# Patient Record
Sex: Male | Born: 2000 | Race: White | Hispanic: No | Marital: Single | Smoking: Current every day smoker
Health system: Southern US, Community
[De-identification: ages and names within clinical notes are randomized; demographics above are authoritative.]

## PROBLEM LIST (undated history)

## (undated) DIAGNOSIS — E119 Type 2 diabetes mellitus without complications: Secondary | ICD-10-CM

---
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
## 2018-03-28 ENCOUNTER — Emergency Department (HOSPITAL_COMMUNITY)
Admission: EM | Admit: 2018-03-28 | Discharge: 2018-03-28 | Disposition: A | Discharge status: Home / Self Care | Payer: BLUE CROSS/BLUE SHIELD | Attending: Emergency Medicine | Admitting: Emergency Medicine

## 2018-03-28 ENCOUNTER — Encounter (HOSPITAL_COMMUNITY): Payer: Self-pay | Admitting: *Deleted

## 2018-03-28 DIAGNOSIS — Z794 Long term (current) use of insulin: Secondary | ICD-10-CM | POA: Diagnosis not present

## 2018-03-28 DIAGNOSIS — W228XXA Striking against or struck by other objects, initial encounter: Secondary | ICD-10-CM | POA: Diagnosis not present

## 2018-03-28 DIAGNOSIS — Y92009 Unspecified place in unspecified non-institutional (private) residence as the place of occurrence of the external cause: Secondary | ICD-10-CM | POA: Diagnosis not present

## 2018-03-28 DIAGNOSIS — E119 Type 2 diabetes mellitus without complications: Secondary | ICD-10-CM | POA: Insufficient documentation

## 2018-03-28 DIAGNOSIS — S0990XA Unspecified injury of head, initial encounter: Secondary | ICD-10-CM

## 2018-03-28 DIAGNOSIS — Y939 Activity, unspecified: Secondary | ICD-10-CM | POA: Insufficient documentation

## 2018-03-28 DIAGNOSIS — Y999 Unspecified external cause status: Secondary | ICD-10-CM | POA: Insufficient documentation

## 2018-03-28 DIAGNOSIS — F17210 Nicotine dependence, cigarettes, uncomplicated: Secondary | ICD-10-CM | POA: Diagnosis not present

## 2018-03-28 NOTE — ED Triage Notes (Signed)
Pt in stating he was working on a house yesterday and a ceiling joyce fell on his head, possible LOC, reports some headaches today but states its mild, no distress noted
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
## 2018-03-28 NOTE — Discharge Instructions (Addendum)
°  Head Injury °You have been seen today for a head injury. It does not appear to be serious at this time.  °Close observation: The close observation period is usually 6 hours from the injury. This includes staying awake and having a trustworthy adult monitor you to assure your condition does not worsen. You should be in regular contact with this person and ideally, they should be able to monitor you in person.  °Secondary observation: The secondary observation period is usually 24 hours from the injury. You are allowed to sleep during this time. A trustworthy adult should intermittently monitor you to assure your condition does not worsen.  ° °Overall head injury/concussion care: °Rest: Be sure to get plenty of rest. You will need more rest and sleep while you recover. °Hydration: Be sure to stay well hydrated by having a goal of drinking about 0.5 liters of water an hour. °Pain:  °Antiinflammatory medications: Take 600 mg of ibuprofen every 6 hours or 440 mg (over the counter dose) to 500 mg (prescription dose) of naproxen every 12 hours or for the next 3 days. After this time, these medications may be used as needed for pain. Take these medications with food to avoid upset stomach. Choose only one of these medications, do not take them together. °Tylenol: Should you continue to have additional pain while taking the ibuprofen or naproxen, you may add in tylenol as needed. Your daily total maximum amount of tylenol from all sources should be limited to 4000mg/day for persons without liver problems, or 2000mg/day for those with liver problems. °Return to sports and activities: In general, you may return to normal activities once symptoms have subsided, however, you would ideally be cleared by a primary care provider or other qualified medical professional prior to return to these activities. ° °Follow up: Follow up with the concussion clinic or your primary care provider for further management of this issue. °Return:  Return to the ED should you begin to have confusion, abnormal behavior, aggression, violence, or personality changes, repeated vomiting, vision loss, numbness or weakness on one side of the body, difficulty standing due to dizziness, significantly worsening pain, or any other major concerns. °

## 2018-03-28 NOTE — ED Notes (Signed)
Patient given discharge instructions and verbalized understanding.  Patient stable to discharge at this time.  Patient is alert and oriented to baseline.  No distressed noted at this time.  All belongings taken with the patient at discharge.   

## 2018-05-06 ENCOUNTER — Other Ambulatory Visit: Payer: Self-pay

## 2018-05-06 ENCOUNTER — Encounter (HOSPITAL_COMMUNITY): Payer: Self-pay

## 2018-05-06 ENCOUNTER — Ambulatory Visit (HOSPITAL_COMMUNITY)
Admission: EM | Admit: 2018-05-06 | Discharge: 2018-05-06 | Disposition: A | Discharge status: Home / Self Care | Payer: BLUE CROSS/BLUE SHIELD | Attending: Family Medicine | Admitting: Family Medicine

## 2018-05-06 DIAGNOSIS — L0201 Cutaneous abscess of face: Secondary | ICD-10-CM | POA: Diagnosis not present

## 2018-05-06 DIAGNOSIS — L03213 Periorbital cellulitis: Secondary | ICD-10-CM

## 2018-05-06 MED ORDER — SULFAMETHOXAZOLE-TRIMETHOPRIM 800-160 MG PO TABS: 1.0000 | ORAL_TABLET | Freq: Two times a day (BID) | ORAL | 0 refills | Status: AC

## 2018-05-06 MED ORDER — AMOXICILLIN-POT CLAVULANATE 875-125 MG PO TABS: 1.0000 | ORAL_TABLET | Freq: Two times a day (BID) | ORAL | 0 refills | Status: DC

## 2018-05-06 NOTE — Discharge Instructions (Signed)
Apply warm compresses 3-4x daily for 10-15 minutes Wash site daily with warm water and mild soap Keep covered to avoid friction Dual therapy is recommending for infections occurring around the eye Bactrim and augmentin prescribed.  Take as directed and to completion Follow up here or with PCP if symptoms do not improve over the next 24-48 hours with treatment Return or go to the ED if you have any new or worsening symptoms fever, chills, nausea, vomiting, increased redness, swelling, or pain, difficulty or pain with eye movements, vision changes, symptoms that do not improve with medications, etc..Marland Kitchen

## 2018-05-06 NOTE — ED Provider Notes (Signed)
[facility] CARE CENTER   [id number] 05/06/18 Arrival Time: 1626  CC: Abscess and eye swellling  SUBJECTIVE:  Shane Michael is a 18 y.o. male hx significant for type 1 DM, who presents with possible abscess x >1 week.  Symptoms began after he hit his head on a cabinet door last week.  Shortly afterwards it began to appear infected.  Patient tried "popping" abscess last night, and then woke up this morning with RT eyelid swelling.   Describes it as painful, with active purulent drainage.  Has tried cleaning without improvement.  Denies similar symptoms in the past.  Complains of associated swelling, redness, and purulent drainage.  Denies fever, chills, vision changes, painful EOM, nausea, vomiting, SOB, chest pain, abdominal pain, changes in bowel or bladder function.    ROS: As per HPI.  Past Medical History:  Diagnosis Date  . Diabetes mellitus without complication (HCC)    History reviewed. No pertinent surgical history. No Known Allergies No current facility-administered medications on file prior to encounter.    Current Outpatient Medications on File Prior to Encounter  Medication Sig Dispense Refill  . ibuprofen (ADVIL,MOTRIN) 600 MG tablet Take 1 tablet (600 mg total) by mouth every 6 (six) hours as needed. 30 tablet 0  . insulin aspart (NOVOLOG) 100 UNIT/ML injection Inject 1-5 Units into the skin See admin instructions. Take 1 (one) unit for every 10 grams of carbohydrates consumed. 150 is 1 unit, 200 is 2 unit, 250 is 3 unit.    Marland Kitchen insulin glargine (LANTUS) 100 UNIT/ML injection Inject 19 Units into the skin daily.      Social History   Socioeconomic History  . Marital status: Single    Spouse name: Not on file  . Number of children: Not on file  . Years of education: Not on file  . Highest education level: Not on file  Occupational History  . Not on file  Social Needs  . Financial resource strain: Not on file  . Food insecurity:    Worry: Not on file    Inability: Not  on file  . Transportation needs:    Medical: Not on file    Non-medical: Not on file  Tobacco Use  . Smoking status: Current Every Day Smoker  . Smokeless tobacco: Never Used  Substance and Sexual Activity  . Alcohol use: No  . Drug use: No  . Sexual activity: Not on file  Lifestyle  . Physical activity:    Days per week: Not on file    Minutes per session: Not on file  . Stress: Not on file  Relationships  . Social connections:    Talks on phone: Not on file    Gets together: Not on file    Attends religious service: Not on file    Active member of club or organization: Not on file    Attends meetings of clubs or organizations: Not on file    Relationship status: Not on file  . Intimate partner violence:    Fear of current or ex partner: Not on file    Emotionally abused: Not on file    Physically abused: Not on file    Forced sexual activity: Not on file  Other Topics Concern  . Not on file  Social History Narrative  . Not on file   History reviewed. No pertinent family history.  OBJECTIVE: Vitals:   05/06/18 1637 05/06/18 1640  BP:  135/67  Pulse:  88  Resp:  18  Temp:  97.6 F (36.4 C)  TempSrc:  Oral  SpO2:  99%  Weight: 141 lb (64 kg)     General appearance: alert; no distress Head: NCAT Eyes: Mild RT preseptal swelling, without overlying erythema; No conjunctival erythema. PERRL; EOMI without discomfort;  no obvious drainage Lungs: clear to auscultation bilaterally Heart: regular rate and rhythm.  Radial pulse 2+ bilaterally Extremities: no edema Skin: warm and dry; 1-2 cm abscess with mild surrounding erythema localized to RT forehead, purulent drainage evident, mildly TTP (see picture below) Psychological: alert and cooperative; normal mood and affect      ASSESSMENT & PLAN:  1. Abscess of forehead   2. Preseptal cellulitis of right upper eyelid     Meds ordered this encounter  Medications  . sulfamethoxazole-trimethoprim (BACTRIM  DS,SEPTRA DS) 800-160 MG tablet    Sig: Take 1 tablet by mouth 2 (two) times daily for 10 days.    Dispense:  20 tablet    Refill:  0    Order Specific Question:   Supervising Provider    Answer:   Eustace Moore [[id number]]  . amoxicillin-clavulanate (AUGMENTIN) 875-125 MG tablet    Sig: Take 1 tablet by mouth every 12 (twelve) hours.    Dispense:  14 tablet    Refill:  0    Order Specific Question:   Supervising Provider    Answer:   Eustace Moore [[id number]]   Apply warm compresses 3-4x daily for 10-15 minutes Wash site daily with warm water and mild soap Keep covered to avoid friction Dual therapy is recommending for infections occurring around the eye Bactrim and augmentin prescribed.  Take as directed and to completion Follow up here or with PCP if symptoms do not improve over the next 24-48 hours with treatment Return or go to the ED if you have any new or worsening symptoms fever, chills, nausea, vomiting, increased redness, swelling, or pain, difficulty or pain with eye movements, vision changes, symptoms that do not improve with medications, etc...  Reviewed expectations re: course of current medical issues. Questions answered. Outlined signs and symptoms indicating need for more acute intervention. Patient verbalized understanding. After Visit Summary given.   Rennis Harding, PA-C 05/06/18 1709

## 2018-05-06 NOTE — ED Triage Notes (Signed)
Pt cc pt states he strapped his head on corner of the  cabinet door. This happened a week ago. Pt right eye is swollen shut.

## 2018-06-19 ENCOUNTER — Ambulatory Visit (INDEPENDENT_AMBULATORY_CARE_PROVIDER_SITE_OTHER): Payer: BLUE CROSS/BLUE SHIELD

## 2018-06-19 ENCOUNTER — Ambulatory Visit (HOSPITAL_COMMUNITY)
Admission: EM | Admit: 2018-06-19 | Discharge: 2018-06-19 | Disposition: A | Discharge status: Home / Self Care | Payer: BLUE CROSS/BLUE SHIELD | Attending: Family Medicine | Admitting: Family Medicine

## 2018-06-19 ENCOUNTER — Encounter (HOSPITAL_COMMUNITY): Payer: Self-pay | Admitting: Family Medicine

## 2018-06-19 DIAGNOSIS — S90212A Contusion of left great toe with damage to nail, initial encounter: Secondary | ICD-10-CM | POA: Diagnosis not present

## 2018-06-19 DIAGNOSIS — S92405A Nondisplaced unspecified fracture of left great toe, initial encounter for closed fracture: Secondary | ICD-10-CM
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
## 2018-06-19 NOTE — Discharge Instructions (Signed)
Please return in two weeks for follow up  Wear the post-op shoe to keep the toe from bending when you are walking about.

## 2019-03-15 DIAGNOSIS — S91309A Unspecified open wound, unspecified foot, initial encounter: Secondary | ICD-10-CM

## 2019-03-15 DIAGNOSIS — E119 Type 2 diabetes mellitus without complications: Secondary | ICD-10-CM

## 2019-03-15 DIAGNOSIS — R7989 Other specified abnormal findings of blood chemistry: Secondary | ICD-10-CM

## 2019-03-15 DIAGNOSIS — E871 Hypo-osmolality and hyponatremia: Secondary | ICD-10-CM

## 2019-03-16 DIAGNOSIS — E119 Type 2 diabetes mellitus without complications: Secondary | ICD-10-CM | POA: Diagnosis not present

## 2019-03-16 DIAGNOSIS — S91309A Unspecified open wound, unspecified foot, initial encounter: Secondary | ICD-10-CM | POA: Diagnosis not present

## 2019-03-16 DIAGNOSIS — E871 Hypo-osmolality and hyponatremia: Secondary | ICD-10-CM | POA: Diagnosis not present

## 2019-03-16 DIAGNOSIS — R7989 Other specified abnormal findings of blood chemistry: Secondary | ICD-10-CM | POA: Diagnosis not present

## 2020-05-10 ENCOUNTER — Other Ambulatory Visit: Payer: Self-pay

## 2020-05-10 ENCOUNTER — Encounter (HOSPITAL_COMMUNITY): Payer: Self-pay | Admitting: Emergency Medicine

## 2020-05-10 ENCOUNTER — Emergency Department (HOSPITAL_COMMUNITY): Payer: 59

## 2020-05-10 ENCOUNTER — Emergency Department (HOSPITAL_COMMUNITY)
Admission: EM | Admit: 2020-05-10 | Discharge: 2020-05-10 | Disposition: A | Discharge status: Home / Self Care | Payer: 59 | Attending: Emergency Medicine | Admitting: Emergency Medicine

## 2020-05-10 DIAGNOSIS — Z23 Encounter for immunization: Secondary | ICD-10-CM | POA: Insufficient documentation

## 2020-05-10 DIAGNOSIS — R739 Hyperglycemia, unspecified: Secondary | ICD-10-CM

## 2020-05-10 DIAGNOSIS — L089 Local infection of the skin and subcutaneous tissue, unspecified: Secondary | ICD-10-CM

## 2020-05-10 DIAGNOSIS — E11621 Type 2 diabetes mellitus with foot ulcer: Secondary | ICD-10-CM | POA: Diagnosis not present

## 2020-05-10 DIAGNOSIS — L97519 Non-pressure chronic ulcer of other part of right foot with unspecified severity: Secondary | ICD-10-CM | POA: Insufficient documentation

## 2020-05-10 DIAGNOSIS — M79671 Pain in right foot: Secondary | ICD-10-CM | POA: Diagnosis present

## 2020-05-10 DIAGNOSIS — F17200 Nicotine dependence, unspecified, uncomplicated: Secondary | ICD-10-CM | POA: Diagnosis not present

## 2020-05-10 DIAGNOSIS — Z794 Long term (current) use of insulin: Secondary | ICD-10-CM | POA: Diagnosis not present

## 2020-05-10 DIAGNOSIS — E1165 Type 2 diabetes mellitus with hyperglycemia: Secondary | ICD-10-CM | POA: Diagnosis not present

## 2020-05-10 LAB — CBC WITH DIFFERENTIAL/PLATELET
Abs Immature Granulocytes: 0.04 10*3/uL (ref 0.00–0.07)
Basophils Absolute: 0.1 10*3/uL (ref 0.0–0.1)
Basophils Relative: 0 %
Eosinophils Absolute: 0.2 10*3/uL (ref 0.0–0.5)
Eosinophils Relative: 1 %
HCT: 42.5 % (ref 39.0–52.0)
Hemoglobin: 15 g/dL (ref 13.0–17.0)
Immature Granulocytes: 0 %
Lymphocytes Relative: 17 %
Lymphs Abs: 2 10*3/uL (ref 0.7–4.0)
MCH: 31.3 pg (ref 26.0–34.0)
MCHC: 35.3 g/dL (ref 30.0–36.0)
MCV: 88.5 fL (ref 80.0–100.0)
Monocytes Absolute: 1.4 10*3/uL — ABNORMAL HIGH (ref 0.1–1.0)
Monocytes Relative: 11 %
Neutro Abs: 8.7 10*3/uL — ABNORMAL HIGH (ref 1.7–7.7)
Neutrophils Relative %: 71 %
Platelets: 276 10*3/uL (ref 150–400)
RBC: 4.8 MIL/uL (ref 4.22–5.81)
RDW: 11.9 % (ref 11.5–15.5)
WBC: 12.4 10*3/uL — ABNORMAL HIGH (ref 4.0–10.5)
nRBC: 0 % (ref 0.0–0.2)

## 2020-05-10 LAB — BASIC METABOLIC PANEL
Anion gap: 12 (ref 5–15)
BUN: 20 mg/dL (ref 6–20)
CO2: 22 mmol/L (ref 22–32)
Calcium: 9.3 mg/dL (ref 8.9–10.3)
Chloride: 95 mmol/L — ABNORMAL LOW (ref 98–111)
Creatinine, Ser: 0.76 mg/dL (ref 0.61–1.24)
GFR, Estimated: >60 mL/min (ref >60)
Glucose, Bld: 508 mg/dL (ref 70–99)
Potassium: 4.3 mmol/L (ref 3.5–5.1)
Sodium: 129 mmol/L — ABNORMAL LOW (ref 135–145)

## 2020-05-10 LAB — CBG MONITORING, ED
Glucose-Capillary: 501 mg/dL (ref 70–99)
Glucose-Capillary: 284 mg/dL — ABNORMAL HIGH (ref 70–99)

## 2020-05-10 MED ORDER — DOXYCYCLINE HYCLATE 100 MG PO CAPS: 100.0000 mg | ORAL_CAPSULE | Freq: Two times a day (BID) | ORAL | 0 refills | Status: AC

## 2020-05-10 MED ORDER — TETANUS-DIPHTH-ACELL PERTUSSIS 5-2.5-18.5 LF-MCG/0.5 IM SUSY
0.5000 mL | PREFILLED_SYRINGE | Freq: Once | INTRAMUSCULAR | Status: AC
  Administered 2020-05-10: 0.5 mL via INTRAMUSCULAR
  Filled 2020-05-10: qty 0.5

## 2020-05-10 MED ORDER — SODIUM CHLORIDE 0.9 % IV SOLN
3.0000 g | Freq: Once | INTRAVENOUS | Status: AC
  Administered 2020-05-10: 3 g via INTRAVENOUS
  Filled 2020-05-10: qty 8

## 2020-05-10 MED ORDER — SODIUM CHLORIDE 0.9 % IV BOLUS
1000.0000 mL | Freq: Once | INTRAVENOUS | Status: AC
  Administered 2020-05-10: 1000 mL via INTRAVENOUS

## 2020-05-10 MED ORDER — INSULIN ASPART 100 UNIT/ML ~~LOC~~ SOLN
10.0000 [IU] | Freq: Once | SUBCUTANEOUS | Status: AC
  Administered 2020-05-10: 10 [IU] via INTRAVENOUS
  Filled 2020-05-10: qty 0.1

## 2020-05-10 MED ORDER — LIDOCAINE-EPINEPHRINE (PF) 2 %-1:200000 IJ SOLN
10.0000 mL | Freq: Once | INTRAMUSCULAR | Status: AC
  Administered 2020-05-10: 10 mL
  Filled 2020-05-10: qty 20

## 2020-05-10 NOTE — ED Triage Notes (Signed)
Pt stepped on a kabob stick with his R foot 2-3 days ago. Reports that he tried to get most of it out, but the area is looking worse. Erythema noted. Pt is diabetic.

## 2020-05-10 NOTE — ED Provider Notes (Signed)
[facility]   CSN: [id number] Arrival date & time: 05/10/20  0202     History Chief Complaint  Patient presents with  . Foreign body in Foot    Shane Michael is a 20 y.o. male.  Patient presents to the emergency department with a chief complaint of foot pain.  He states that he stepped on a wooden kebab stick a couple of days ago.  States that the stick broke in the middle, and he got a splinter on the ball of his foot.  He states that he was able to retrieve 1 small splinter, but is uncertain if there is more that remain in his foot.  He reports swelling and pain and redness.  Denies any fever.  He is diabetic.  The history is provided by the patient. No language interpreter was used.       Past Medical History:  Diagnosis Date  . Diabetes mellitus without complication [facility])     Patient Active Problem List   Diagnosis Date Noted  . Uncontrolled diabetes mellitus (HCC) 01/04/2012  . DKA (diabetic ketoacidoses) 01/02/2012  . Dehydration 01/02/2012    History reviewed. No pertinent surgical history.     History reviewed. No pertinent family history.  Social History   Tobacco Use  . Smoking status: Current Every Day Smoker  . Smokeless tobacco: Never Used  Substance Use Topics  . Alcohol use: No  . Drug use: No    Home Medications Prior to Admission medications   Medication Sig Start Date End Date Taking? Authorizing Provider  ibuprofen (ADVIL,MOTRIN) 600 MG tablet Take 1 tablet (600 mg total) by mouth every 6 (six) hours as needed. 11/03/14   Niel Hummer, MD  insulin aspart (NOVOLOG) 100 UNIT/ML injection Inject 1-5 Units into the skin See admin instructions. Take 1 (one) unit for every 10 grams of carbohydrates consumed. 150 is 1 unit, 200 is 2 unit, 250 is 3 unit.    [provider]  insulin glargine (LANTUS) 100 UNIT/ML injection Inject 19 Units into the skin daily.     [provider]     Allergies    Patient has no known allergies.  Review of Systems   Review of Systems  All other systems reviewed and are negative.   Physical Exam Updated Vital Signs BP 121/80 (BP Location: Left Arm)   Pulse 82   Temp 97.9 F (36.6 C) (Oral)   Resp 18   Ht 5\' 11"  (1.803 m)   Wt 63.5 kg   SpO2 99%   BMI 19.53 kg/m   Physical Exam Vitals and nursing note reviewed.  Constitutional:      General: He is not in acute distress.    Appearance: He is well-developed. He is not ill-appearing.  HENT:     Head: Normocephalic and atraumatic.  Eyes:     Conjunctiva/sclera: Conjunctivae normal.  Cardiovascular:     Rate and Rhythm: Normal rate.  Pulmonary:     Effort: Pulmonary effort is normal. No respiratory distress.  Abdominal:     General: There is no distension.  Musculoskeletal:     Cervical back: Neck supple.     Comments: Moves all extremities  Skin:    General: Skin is warm and dry.     Comments: Erythema and swelling at the base of the 3rd toe on the plantar surface  Neurological:     Mental Status: He is alert and oriented to person, place, and time.  Psychiatric:  Mood and Affect: Mood normal.        Behavior: Behavior normal.     ED Results / Procedures / Treatments   Labs (all labs ordered are listed, but only abnormal results are displayed) Labs Reviewed  CBC WITH DIFFERENTIAL/PLATELET  BASIC METABOLIC PANEL    EKG None  Radiology No results found.  Procedures .Marland KitchenIncision and Drainage  Date/Time: 05/10/2020 5:04 AM Performed by: Roxy Horseman, PA-C Authorized by: Roxy Horseman, PA-C   Consent:    Consent obtained:  Verbal   Consent given by:  Patient   Risks, benefits, and alternatives were discussed: yes     Risks discussed:  Bleeding, incomplete drainage, pain and infection   Alternatives discussed:  Alternative treatment Universal protocol:    Procedure explained and questions answered to patient or proxy's satisfaction:  yes     Relevant documents present and verified: yes     Test results available : yes     Imaging studies available: yes     Required blood products, implants, devices, and special equipment available: yes     Site/side marked: yes     Immediately prior to procedure, a time out was called: yes     Patient identity confirmed:  Verbally with patient Location:    Type:  Abscess   Size:  1x1 cm   Location:  Lower extremity   Lower extremity location:  Foot   Foot location:  R foot Pre-procedure details:    Skin preparation:  Povidone-iodine Sedation:    Sedation type:  None Anesthesia:    Anesthesia method:  None Procedure type:    Complexity:  Simple Procedure details:    Ultrasound guidance: no     Needle aspiration: no     Incision types:  Single straight   Incision depth:  Subcutaneous   Wound management:  Probed and deloculated and irrigated with saline   Drainage:  Bloody   Drainage amount:  Scant   Wound treatment:  Wound left open   Packing materials:  1/4 in iodoform gauze   Amount 1/4" iodoform:  0.5" Post-procedure details:    Procedure completion:  Tolerated well, no immediate complications     Medications Ordered in ED Medications - No data to display  ED Course  I have reviewed the triage vital signs and the nursing notes.  Pertinent labs & imaging results that were available during my care of the patient were reviewed by me and considered in my medical decision making (see chart for details).    MDM Rules/Calculators/A&P                          Patient here with complaint of questionable foreign body in foot.  He states that he stepped on a wooden skewer.  It broke underneath his foot, and he had a splinter in his foot.  He states that he was able to remove this, but over the past day or 2 he has developed redness and swelling with pain.  He states that there was some pus that drained out earlier today.  He is prompted by his girlfriend to come into the  ER.  Incision and drainage yielded a scant amount of purulent discharge.  No foreign body was obtained.  Plain films were negative.  Packing was placed.  Patient started on antibiotics.  He has type I diabetic.  His glucose is noted to be high.  He states he rarely checks it.  Patient will  need close follow-up with PCP.  Return to the emergency department if symptoms worsen.  Will provide patient with a postop shoe and crutches for comfort. Final Clinical Impression(s) / ED Diagnoses Final diagnoses:  Foot infection  Hyperglycemia    Rx / DC Orders ED Discharge Orders         Ordered    doxycycline (VIBRAMYCIN) 100 MG capsule  2 times daily        05/10/20 0526           Roxy Horseman, PA-C 05/10/20 0527    Melene Plan, DO 05/10/20 [phone]

## 2020-05-10 NOTE — ED Notes (Signed)
Patient given shoe and crutches before discharge.
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
## 2021-07-26 ENCOUNTER — Other Ambulatory Visit: Payer: Self-pay

## 2021-07-26 ENCOUNTER — Emergency Department (HOSPITAL_BASED_OUTPATIENT_CLINIC_OR_DEPARTMENT_OTHER): Admit: 2021-07-26 | Discharge: 2021-07-26 | Disposition: A | Discharge status: Home / Self Care | Payer: 59

## 2021-07-26 ENCOUNTER — Encounter (HOSPITAL_COMMUNITY): Payer: Self-pay

## 2021-07-26 ENCOUNTER — Emergency Department (HOSPITAL_COMMUNITY)
Admission: EM | Admit: 2021-07-26 | Discharge: 2021-07-26 | Discharge status: Against Medical Advice | Payer: 59 | Attending: Emergency Medicine | Admitting: Emergency Medicine

## 2021-07-26 DIAGNOSIS — M79605 Pain in left leg: Secondary | ICD-10-CM

## 2021-07-26 DIAGNOSIS — E119 Type 2 diabetes mellitus without complications: Secondary | ICD-10-CM | POA: Insufficient documentation

## 2021-07-26 DIAGNOSIS — Z5321 Procedure and treatment not carried out due to patient leaving prior to being seen by health care provider: Secondary | ICD-10-CM | POA: Insufficient documentation

## 2021-07-26 DIAGNOSIS — R202 Paresthesia of skin: Secondary | ICD-10-CM | POA: Insufficient documentation

## 2021-07-26 LAB — BASIC METABOLIC PANEL
Anion gap: 9 (ref 5–15)
BUN: 18 mg/dL (ref 6–20)
CO2: 23 mmol/L (ref 22–32)
Calcium: 9.4 mg/dL (ref 8.9–10.3)
Chloride: 106 mmol/L (ref 98–111)
Creatinine, Ser: 0.68 mg/dL (ref 0.61–1.24)
GFR, Estimated: >60 mL/min (ref >60)
Glucose, Bld: 101 mg/dL — ABNORMAL HIGH (ref 70–99)
Potassium: 3.7 mmol/L (ref 3.5–5.1)
Sodium: 138 mmol/L (ref 135–145)

## 2021-07-26 LAB — CBC WITH DIFFERENTIAL/PLATELET
Abs Immature Granulocytes: 0.02 10*3/uL (ref 0.00–0.07)
Basophils Absolute: 0 10*3/uL (ref 0.0–0.1)
Basophils Relative: 1 %
Eosinophils Absolute: 0.1 10*3/uL (ref 0.0–0.5)
Eosinophils Relative: 1 %
HCT: 44.2 % (ref 39.0–52.0)
Hemoglobin: 15.5 g/dL (ref 13.0–17.0)
Immature Granulocytes: 0 %
Lymphocytes Relative: 28 %
Lymphs Abs: 2.4 10*3/uL (ref 0.7–4.0)
MCH: 29.4 pg (ref 26.0–34.0)
MCHC: 35.1 g/dL (ref 30.0–36.0)
MCV: 83.7 fL (ref 80.0–100.0)
Monocytes Absolute: 0.9 10*3/uL (ref 0.1–1.0)
Monocytes Relative: 11 %
Neutro Abs: 5.2 10*3/uL (ref 1.7–7.7)
Neutrophils Relative %: 59 %
Platelets: 293 10*3/uL (ref 150–400)
RBC: 5.28 MIL/uL (ref 4.22–5.81)
RDW: 11.9 % (ref 11.5–15.5)
WBC: 8.6 10*3/uL (ref 4.0–10.5)
nRBC: 0 % (ref 0.0–0.2)

## 2021-07-26 NOTE — Progress Notes (Signed)
Left lower extremity venous duplex completed. Refer to "CV Proc" under chart review to view preliminary results.  07/26/2021 2:44 PM Eula Fried., MHA, RVT, RDCS, RDMS

## 2021-07-26 NOTE — ED Notes (Signed)
Pt called 4x no answer  

## 2021-07-26 NOTE — ED Provider Triage Note (Signed)
Emergency Medicine Provider Triage Evaluation Note  Shane Michael , a 21 y.o. male  was evaluated in triage.  Pt complains of paresthesias to bilateral lower extremities and left leg pain.  Sent from office for ultrasound to rule out DVT left lower extremity.  States that he has had these paresthesias in his feet when his sugars are high but does not feel that his sugars are high now.  Has been compliant with medications.  Review of Systems  Positive: Paresthesias, left leg pain Negative: Chest pain, shortness of breath  Physical Exam  BP 116/73 (BP Location: Left Arm)   Pulse 77   Temp 98.1 F (36.7 C) (Oral)   Resp 17   Ht 5\' 11"  (1.803 m)   Wt 63.5 kg   SpO2 99%   BMI 19.53 kg/m  Gen:   Awake, no distress   Resp:  Normal effort  MSK:   Moves extremities without difficulty  Other:  No visible edema noted to bilateral lower extremities, 2+ DP pulses noted bilaterally.  No calf tenderness bilaterally  Medical Decision Making  Medically screening exam initiated at 2:07 PM.  Appropriate orders placed.  Shane Michael was informed that the remainder of the evaluation will be completed by another provider, this initial triage assessment does not replace that evaluation, and the importance of remaining in the ED until their evaluation is complete.  Ultrasound and labs ordered   Alanda Amass, PA-C 07/26/21 1408

## 2021-07-26 NOTE — ED Triage Notes (Signed)
Patient reports he is diabetic and both feet are tingling.  Denies hyperglycemia or wounds to either foot
